# Patient Record
Sex: Female | Born: 1970 | Race: Black or African American | Hispanic: No | Marital: Married | Smoking: Never smoker
Health system: Southern US, Community
[De-identification: ages and names within clinical notes are randomized; demographics above are authoritative.]

---

## 2002-07-03 ENCOUNTER — Encounter: Payer: Self-pay | Admitting: Emergency Medicine

## 2002-07-03 ENCOUNTER — Emergency Department (HOSPITAL_COMMUNITY): Admission: EM | Admit: 2002-07-03 | Discharge: 2002-07-03 | Payer: Self-pay | Admitting: Emergency Medicine

## 2004-04-23 ENCOUNTER — Other Ambulatory Visit: Admission: RE | Admit: 2004-04-23 | Discharge: 2004-04-23 | Payer: Self-pay | Admitting: Obstetrics and Gynecology

## 2005-02-25 ENCOUNTER — Encounter: Admission: RE | Admit: 2005-02-25 | Discharge: 2005-02-25 | Payer: Self-pay | Admitting: Emergency Medicine

## 2007-04-15 ENCOUNTER — Encounter: Admission: RE | Admit: 2007-04-15 | Discharge: 2007-04-15 | Payer: Self-pay | Admitting: Obstetrics and Gynecology

## 2009-09-27 ENCOUNTER — Encounter: Admission: RE | Admit: 2009-09-27 | Discharge: 2009-09-27 | Payer: Self-pay | Admitting: Obstetrics and Gynecology

## 2009-10-15 ENCOUNTER — Encounter: Admission: RE | Admit: 2009-10-15 | Discharge: 2009-10-15 | Payer: Self-pay | Admitting: Obstetrics and Gynecology

## 2010-11-03 ENCOUNTER — Encounter: Admission: RE | Admit: 2010-11-03 | Discharge: 2010-11-03 | Payer: Self-pay | Admitting: Obstetrics and Gynecology

## 2011-10-19 ENCOUNTER — Other Ambulatory Visit: Payer: Self-pay | Admitting: Obstetrics and Gynecology

## 2011-10-19 ENCOUNTER — Other Ambulatory Visit: Payer: Self-pay | Admitting: *Deleted

## 2011-10-19 DIAGNOSIS — Z1231 Encounter for screening mammogram for malignant neoplasm of breast: Secondary | ICD-10-CM

## 2011-12-10 ENCOUNTER — Ambulatory Visit
Admission: RE | Admit: 2011-12-10 | Discharge: 2011-12-10 | Disposition: A | Discharge status: Home / Self Care | Payer: BC Managed Care – PPO | Source: Ambulatory Visit | Attending: Obstetrics and Gynecology | Admitting: Obstetrics and Gynecology

## 2011-12-10 DIAGNOSIS — Z1231 Encounter for screening mammogram for malignant neoplasm of breast: Secondary | ICD-10-CM

## 2012-10-12 ENCOUNTER — Other Ambulatory Visit: Payer: Self-pay | Admitting: Obstetrics and Gynecology

## 2012-10-12 DIAGNOSIS — Z1231 Encounter for screening mammogram for malignant neoplasm of breast: Secondary | ICD-10-CM

## 2012-12-12 ENCOUNTER — Ambulatory Visit
Admission: RE | Admit: 2012-12-12 | Discharge: 2012-12-12 | Disposition: A | Discharge status: Home / Self Care | Payer: BC Managed Care – PPO | Source: Ambulatory Visit | Attending: Obstetrics and Gynecology | Admitting: Obstetrics and Gynecology

## 2012-12-12 DIAGNOSIS — Z1231 Encounter for screening mammogram for malignant neoplasm of breast: Secondary | ICD-10-CM

## 2013-11-16 ENCOUNTER — Other Ambulatory Visit: Payer: Self-pay

## 2013-11-16 DIAGNOSIS — Z1231 Encounter for screening mammogram for malignant neoplasm of breast: Secondary | ICD-10-CM

## 2014-03-06 ENCOUNTER — Ambulatory Visit: Payer: BC Managed Care – PPO

## 2014-03-09 ENCOUNTER — Ambulatory Visit
Admission: RE | Admit: 2014-03-09 | Discharge: 2014-03-09 | Disposition: A | Discharge status: Home / Self Care | Payer: BC Managed Care – PPO | Source: Ambulatory Visit

## 2014-03-09 DIAGNOSIS — Z1231 Encounter for screening mammogram for malignant neoplasm of breast: Secondary | ICD-10-CM

## 2015-04-24 ENCOUNTER — Other Ambulatory Visit: Payer: Self-pay

## 2015-04-24 DIAGNOSIS — Z1231 Encounter for screening mammogram for malignant neoplasm of breast: Secondary | ICD-10-CM

## 2015-06-04 ENCOUNTER — Ambulatory Visit
Admission: RE | Admit: 2015-06-04 | Discharge: 2015-06-04 | Disposition: A | Discharge status: Home / Self Care | Payer: BC Managed Care – PPO | Source: Ambulatory Visit

## 2015-06-04 ENCOUNTER — Ambulatory Visit (INDEPENDENT_AMBULATORY_CARE_PROVIDER_SITE_OTHER): Payer: BC Managed Care – PPO | Admitting: Podiatry

## 2015-06-04 ENCOUNTER — Ambulatory Visit: Payer: Self-pay

## 2015-06-04 ENCOUNTER — Encounter: Payer: Self-pay | Admitting: Podiatry

## 2015-06-04 VITALS — BP 117/75 | HR 83 | Resp 12

## 2015-06-04 DIAGNOSIS — M2011 Hallux valgus (acquired), right foot: Secondary | ICD-10-CM | POA: Diagnosis not present

## 2015-06-04 DIAGNOSIS — L84 Corns and callosities: Secondary | ICD-10-CM

## 2015-06-04 DIAGNOSIS — M21611 Bunion of right foot: Secondary | ICD-10-CM

## 2015-06-04 DIAGNOSIS — M779 Enthesopathy, unspecified: Secondary | ICD-10-CM | POA: Diagnosis not present

## 2015-06-04 DIAGNOSIS — Z1231 Encounter for screening mammogram for malignant neoplasm of breast: Secondary | ICD-10-CM

## 2015-06-04 DIAGNOSIS — M2041 Other hammer toe(s) (acquired), right foot: Secondary | ICD-10-CM | POA: Diagnosis not present

## 2015-06-04 MED ORDER — TRIAMCINOLONE ACETONIDE 10 MG/ML IJ SUSP: 10.0000 mg | Freq: Once | INTRAMUSCULAR | Status: AC

## 2015-06-04 NOTE — Progress Notes (Signed)
   Subjective:    Patient ID: Janice Montes, female    DOB: 08-29-1971, 44 y.o.   MRN: [id number]  HPI Patient presents BIL corns which appeared about 3 months ago. Corns on 4th toes do not cause as much discomfort as the R1-2nd toes do. Patient has tried to use over the counter corn remover to no success.   Review of Systems  Constitutional: Negative.   HENT: Positive for sinus pressure.   Eyes: Negative.   Respiratory: Negative.   Cardiovascular: Negative.   Gastrointestinal: Negative.   Endocrine: Negative.   Genitourinary: Negative.   Musculoskeletal: Positive for back pain.  Skin: Positive for rash and wound.  Allergic/Immunologic: Negative.   Neurological: Negative.   Hematological: Negative.   Psychiatric/Behavioral: Negative.        Objective:   Physical Exam        Assessment & Plan:

## 2015-06-04 NOTE — Progress Notes (Signed)
Subjective:     Patient ID: Janice Montes, female   DOB: 05/15/1971, 44 y.o.   MRN: [id number]  HPI patient states that she's been getting more problems between the big toe and second toe of the right foot with lesion formation and pain. Does not remember any other change but she did have previous bunion surgery done on this foot approximate 7 years ago and on the left foot done approximately 3 years ago   Review of Systems  All other systems reviewed and are negative.      Objective:   Physical Exam  Constitutional: She is oriented to person, place, and time.  Cardiovascular: Intact distal pulses.   Musculoskeletal: Normal range of motion.  Neurological: She is oriented to person, place, and time.  Skin: Skin is warm.  Nursing note and vitals reviewed.  neurovascular status intact muscle strength adequate range of motion within normal limits. Patient's found to have healed surgical sites right and left first metatarsal with deviation of the right hallux against the second toe with large hyperostosis and minimal on the left foot. There is an inflammatory callus formation on the right second toe and right big toe with fluid buildup at the interphalangeal joint right second toe with depression of the arch noted bilateral     Assessment:     Inflammatory capsulitis right second digit with structural abnormality the first metatarsal and bunion that has returned over the last 7 years with screw in place first metatarsal    Plan:     H&P and x-rays reviewed of both feet. Today I did a careful injection of the interphalangeal joint right second toe and debrided lesions on the right big toe second toe and then scanned for custom orthotics to try to lift the arch and take pressure off the medial arch of both feet. Patient may ultimately require surgery and I explained possibility for Lapidus fusion with this condition but that we'll be made based on response to conservative care

## 2015-06-25 ENCOUNTER — Ambulatory Visit: Payer: BC Managed Care – PPO | Admitting: *Deleted

## 2015-06-25 DIAGNOSIS — M779 Enthesopathy, unspecified: Secondary | ICD-10-CM

## 2015-06-25 NOTE — Patient Instructions (Signed)

## 2015-06-27 NOTE — Progress Notes (Signed)
Patient ID: Janice Montes, female   DOB: 1971-02-26, 44 y.o.   MRN: [id number] Patient presents for orthotic pick up.  Verbal and written break in and wear instructions given.  Patient will follow up in 4 weeks if symptoms worsen or fail to improve.

## 2015-09-12 ENCOUNTER — Telehealth: Payer: Self-pay | Admitting: *Deleted

## 2015-09-12 NOTE — Telephone Encounter (Signed)
I left patient a message that she has a $700 deductible.  Once deductible is met insurance will cover at 80% and you will be responsible for 20%.  The estimate for physician is about $1000.  We do make payment arrangements.  You will need to contact the surgical center to get their cost as well as anesthesia.  Call if you have any additional questions.

## 2015-09-12 NOTE — Telephone Encounter (Signed)
"  Can I get a cost analysis for surgery?"

## 2016-06-22 ENCOUNTER — Other Ambulatory Visit: Payer: Self-pay | Admitting: Obstetrics and Gynecology

## 2016-06-22 DIAGNOSIS — Z1231 Encounter for screening mammogram for malignant neoplasm of breast: Secondary | ICD-10-CM

## 2016-07-09 ENCOUNTER — Ambulatory Visit: Payer: BC Managed Care – PPO

## 2016-09-17 ENCOUNTER — Ambulatory Visit
Admission: RE | Admit: 2016-09-17 | Discharge: 2016-09-17 | Disposition: A | Discharge status: Home / Self Care | Payer: BC Managed Care – PPO | Source: Ambulatory Visit | Attending: Obstetrics and Gynecology | Admitting: Obstetrics and Gynecology

## 2016-09-17 ENCOUNTER — Encounter: Payer: Self-pay | Admitting: Podiatry

## 2016-09-17 ENCOUNTER — Ambulatory Visit (INDEPENDENT_AMBULATORY_CARE_PROVIDER_SITE_OTHER): Payer: BC Managed Care – PPO | Admitting: Podiatry

## 2016-09-17 ENCOUNTER — Ambulatory Visit (INDEPENDENT_AMBULATORY_CARE_PROVIDER_SITE_OTHER): Payer: BC Managed Care – PPO

## 2016-09-17 DIAGNOSIS — Z1231 Encounter for screening mammogram for malignant neoplasm of breast: Secondary | ICD-10-CM

## 2016-09-17 DIAGNOSIS — M79672 Pain in left foot: Secondary | ICD-10-CM

## 2016-09-17 DIAGNOSIS — M79671 Pain in right foot: Secondary | ICD-10-CM

## 2016-09-17 DIAGNOSIS — M21611 Bunion of right foot: Secondary | ICD-10-CM

## 2016-09-17 DIAGNOSIS — M2041 Other hammer toe(s) (acquired), right foot: Secondary | ICD-10-CM

## 2016-09-17 NOTE — Progress Notes (Signed)
Subjective:     Patient ID: Janice Montes, female   DOB: [birth year], 45 y.o.   MRN: [id number]  HPI patient states that she's getting increased pain between the big toe and second toe of the right foot with keratotic lesion second digit. States that when I trimmed a gave her temporary relief but it's becoming more tender for   Review of Systems     Objective:   Physical Exam Neurovascular status intact muscle strength adequate range of motion within normal limits with patient found to have significant structural bunion deformity with hyperostosis on the medial side the second toe and big toe or inner facing with abnormalities of the hallux with pressure between the hallux and second toe with keratotic lesion formation between the 2. 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 I discussed at great length the problem of this and the elevation of the intermetatarsal angle and I do think long-term it would be best to consider Lapidus fusion along with possible distal osteotomy or Akin osteotomy with arthroplasty second digit and removal of screw. I also discussed a very simple procedure which would allow her to weight-bear right away consisting of removal of bone along with Akin osteotomy and arthroplasty digit 2. She most likely will opt for the more complex procedure and I am get a have her see Dr. Ardelle AntonWagoner for consult in mid November with surgery tentatively to be done in the middle of December  X-ray report indicate significant elevation of the intermetatarsal angle screw in position first metatarsal with  angle which has not been adequately resolved with a abutment of the hallux against second toe with elevation of hallux interphalangeus angle

## 2016-10-23 ENCOUNTER — Ambulatory Visit: Payer: BC Managed Care – PPO | Admitting: Podiatry

## 2016-10-23 ENCOUNTER — Ambulatory Visit (INDEPENDENT_AMBULATORY_CARE_PROVIDER_SITE_OTHER): Payer: BC Managed Care – PPO | Admitting: Podiatry

## 2016-10-23 DIAGNOSIS — M79671 Pain in right foot: Secondary | ICD-10-CM | POA: Diagnosis not present

## 2016-10-23 DIAGNOSIS — M79672 Pain in left foot: Secondary | ICD-10-CM

## 2016-10-23 DIAGNOSIS — M2041 Other hammer toe(s) (acquired), right foot: Secondary | ICD-10-CM | POA: Diagnosis not present

## 2016-10-23 DIAGNOSIS — L84 Corns and callosities: Secondary | ICD-10-CM | POA: Diagnosis not present

## 2016-10-23 DIAGNOSIS — M21611 Bunion of right foot: Secondary | ICD-10-CM | POA: Diagnosis not present

## 2016-10-27 NOTE — Progress Notes (Signed)
Subjective: 45 year old female presents the office today for surgical consultation of pain to her right foot. She has a corn between her big toe and her second toe which is painful. She does have a history of bunion surgery 10 years ago however she did have reoccurrence. Her big toe does about her second toe causing discomfort. She has tried shoe changes, offloading, padding without any significant resolution. At today's visit she presents today for surgical consultation. Denies any systemic complaints such as fevers, chills, nausea, vomiting. No acute changes since last appointment, and no other complaints at this time.   Objective: AAO x3, NAD DP/PT pulses palpable bilaterally, CRT less than 3 seconds Moderate bunion deformity is present on the right side. There is hallux interphalangeus present  as well as a hammertoe second toe. There is a hyperkeratotic lesion on the abutting surfaces of the medial second toe and the lateral hallux where the toes are pressing. There is no pain or crepitation with MPJ range of motion. Hypermobility of the first ray is present.  No open lesions or pre-ulcerative lesions.  No pain with calf compression, swelling, warmth, erythema  Assessment: Symptomatic hyperkeratotic lesions likely due to bunion and hammertoe deformities  Plan: -All treatment options discussed with the patient including all alternatives, risks, complications.  -X-rays were reviewed as well as the chart. I discussed with the patient both conservative and surgical treatment options. She wishes to pursue a surgical intervention. I discussed with her correction of the bunion deformity as well as the hammertoe. At a long discussion with her in regard to these options. She would likely benefit mostly from a Lapidus bunionectomy with possible Akin and hammertoe correction of the second toe. However she does not wish to have this type of recovery. If she does not want to do this I recommend an Akin  osteotomy as well as hammertoe repair. Discussed with her this may give her temporary results but has a higher chance of recurrence. She will consider her options and call the office for follow-up. -Patient encouraged to call the office with any questions, concerns, change in symptoms.    Ovid CurdMatthew Eirik Schueler, DPM

## 2016-11-09 ENCOUNTER — Telehealth: Payer: Self-pay | Admitting: *Deleted

## 2016-11-09 NOTE — Telephone Encounter (Signed)
"  I would like to schedule my surgery."  When would you like to schedule and have you signed consent forms?  "I would like to do it on December 18.  I have not signed consent forms."  You will need to schedule an appointment with Dr. Ardelle AntonWagoner for a consultation.  "I already saw him for a consultation."  You saw him for your initial visit.  "I saw Dr. Charlsie Merlesegal for my initial visit.  Then I saw Dr. Ardelle AntonWagoner for a consult.  Does that mean I will have to pay another co-pay?"  Yes, you will have to pay another co-pay.  "Can't I just come by and sign the consent forms without seeing him?"  No, you will need to see him so he can go over the procedure in depth.  "He has already done that!  Can I speak to him?"  He is seeing patients, I can give him a message.  "Well do that."  Let me see if I can catch him.  He said he does need to see you but he will not charge you for the visit.  We cannot schedule surgery for December 18.  He can do it on December 20.  Wednesday is his surgery day.  "Wednesday December 20 is fine.  Can you schedule my appointment with him for the consultation?"  Let me transfer you to a scheduler.

## 2016-11-20 ENCOUNTER — Encounter: Payer: Self-pay | Admitting: Podiatry

## 2016-11-20 ENCOUNTER — Ambulatory Visit (INDEPENDENT_AMBULATORY_CARE_PROVIDER_SITE_OTHER): Payer: BC Managed Care – PPO | Admitting: Podiatry

## 2016-11-20 VITALS — BP 114/80

## 2016-11-20 DIAGNOSIS — M21611 Bunion of right foot: Secondary | ICD-10-CM

## 2016-11-20 DIAGNOSIS — M2041 Other hammer toe(s) (acquired), right foot: Secondary | ICD-10-CM

## 2016-11-20 NOTE — Patient Instructions (Signed)

## 2016-11-23 NOTE — Progress Notes (Signed)
Subjective: 45 year old female presents the office today for surgical consultation of pain to her right foot. She continues have a painful corn which recurs on her second toe and her big toe as well. Her big toes pressing on the second toe causing pain with pressure in shoe gear. She previously had a bunion surgery about 10 years ago which resulted in reoccurrence. She is tried shoe changes, offloading, padding the ascending resolution of this time she requested surgical intervention to help decrease her pain and deformity. Denies any systemic complaints such as fevers, chills, nausea, vomiting. No acute changes since last appointment, and no other complaints at this time.   Objective: AAO x3, NAD DP/PT pulses palpable bilaterally, CRT less than 3 seconds Moderate bunion deformity is present on the right side. There is hallux interphalangeus present  as well as a hammertoe second toe. There is a hyperkeratotic lesion on the abutting surfaces of the medial second toe and the lateral hallux where the toes are pressing. There is no pain or crepitation with MPJ range of motion. Hypermobility of the first ray is present.  No open lesions or pre-ulcerative lesions.  No pain with calf compression, swelling, warmth, erythema  Assessment: Symptomatic hyperkeratotic lesions likely due to bunion and hammertoe deformities  Plan: -All treatment options discussed with the patient including all alternatives, risks, complications.  X-rays were again reviewed with the patient. I discussed both conservative and surgical treatment options with the patient. She elects to proceed with surgical intervention. After a long discussion with the patient she has elected to proceed with a Lapidus bunionectomy as well as hammertoe repair of the second toe and possible Akin osteotomy. No promises or guarantees were given as the procedure. Risks of the surgery as well as the postoperative course and she understands this and wishes to  proceed. The incision placement as well as the postoperative course was discussed with the patient. I discussed risks of the surgery which include, but not limited to, infection, bleeding, pain, swelling, need for further surgery, delayed or nonhealing, painful or ugly scar, numbness or sensation changes, over/under correction, recurrence, transfer lesions, further deformity, hardware failure, DVT/PE, loss of toe/foot. Patient understands these risks and wishes to proceed with surgery. The surgical consent was reviewed with the patient all 3 pages were signed. No promises or guarantees were given to the outcome of the procedure. All questions were answered to the best of my ability. Before the surgery the patient was encouraged to call the office if there is any further questions. [id number] basis. -Patient encouraged to call the office with any questions, concerns, change in symptoms.    Ovid CurdMatthew Wagoner, DPM

## 2016-11-24 DIAGNOSIS — M21611 Bunion of right foot: Secondary | ICD-10-CM

## 2016-12-01 ENCOUNTER — Telehealth: Payer: Self-pay | Admitting: *Deleted

## 2016-12-01 ENCOUNTER — Encounter: Payer: Self-pay | Admitting: Podiatry

## 2016-12-01 NOTE — Telephone Encounter (Signed)
I attempted to return patient's call. 

## 2016-12-01 NOTE — Telephone Encounter (Signed)
"  I am scheduled to have surgery on Wednesday, December 20.  I need some documentation for my place of business.  I also need the time of my surgery. Give me a back as soon as possible"

## 2016-12-02 ENCOUNTER — Encounter: Payer: Self-pay | Admitting: Podiatry

## 2016-12-02 DIAGNOSIS — M2011 Hallux valgus (acquired), right foot: Secondary | ICD-10-CM | POA: Diagnosis not present

## 2016-12-02 DIAGNOSIS — M2041 Other hammer toe(s) (acquired), right foot: Secondary | ICD-10-CM | POA: Diagnosis not present

## 2016-12-11 ENCOUNTER — Ambulatory Visit (INDEPENDENT_AMBULATORY_CARE_PROVIDER_SITE_OTHER): Payer: Self-pay | Admitting: Podiatry

## 2016-12-11 ENCOUNTER — Ambulatory Visit (INDEPENDENT_AMBULATORY_CARE_PROVIDER_SITE_OTHER): Payer: BC Managed Care – PPO

## 2016-12-11 ENCOUNTER — Encounter: Payer: Self-pay | Admitting: Podiatry

## 2016-12-11 VITALS — Temp 97.3°F

## 2016-12-11 DIAGNOSIS — M21611 Bunion of right foot: Secondary | ICD-10-CM

## 2016-12-11 DIAGNOSIS — Z9889 Other specified postprocedural states: Secondary | ICD-10-CM

## 2016-12-11 DIAGNOSIS — M2041 Other hammer toe(s) (acquired), right foot: Secondary | ICD-10-CM

## 2016-12-11 MED ORDER — OXYCODONE-ACETAMINOPHEN 5-325 MG PO TABS: 1.0000 | ORAL_TABLET | Freq: Three times a day (TID) | ORAL | 0 refills | Status: AC | PRN

## 2016-12-11 NOTE — Progress Notes (Signed)
Subjective: Janice Montes is a 45 y.o. is seen today in office s/p right Lapidus bunionectomy and 2nd digit hammertoe repair preformed on 12/02/16. They state their pain is controlled with pain medication and is improving. She is remaining nonweightbearing with use of cam boot. She is asking for knee scooter today. Denies any systemic complaints such as fevers, chills, nausea, vomiting. No calf pain, chest pain, shortness of breath.   Objective: General: No acute distress, AAOx3  DP/PT pulses palpable 2/4, CRT < 3 sec to all digits.  Protective sensation intact. Motor function intact.  Right foot: Incision is well coapted without any evidence of dehiscence and sutures are intact. There is no surrounding erythema, ascending cellulitis, fluctuance, crepitus, malodor, drainage/purulence. There is mild edema around the surgical site. There is mild pain along the surgical site. Toes are rectus position. K wire intact. No other areas of tenderness to bilateral lower extremities.  No other open lesions or pre-ulcerative lesions.  No pain with calf compression, swelling, warmth, erythema.   Assessment and Plan:  Status post Right foot surgery, doing well with no complications   -Treatment options discussed including all alternatives, risks, and complications -X-rays were obtained and reviewed with the patient. Status post Lapidus bunionectomy and hammertoe repair. Hardware intact. No evidence of acute fracture otherwise. -Antibiotic was applied followed by a bandage.Marland Kitchen. Keep the dressing clean, dry, intact. -Continue nonweightbearing with use of cam boot, knee scooter. She was given information about a knee scooter. -Ice/elevation -Pain medication as needed- Refilled today -Monitor for any clinical signs or symptoms of infection and DVT/PE and directed to call the office immediately should any occur or go to the ER. -Follow-up in 1 week for suture removal or sooner if any problems arise. In the  meantime, encouraged to call the office with any questions, concerns, change in symptoms.   Ovid CurdMatthew Seanne Chirico, DPM

## 2016-12-18 ENCOUNTER — Ambulatory Visit: Payer: BC Managed Care – PPO | Admitting: Podiatry

## 2016-12-28 ENCOUNTER — Ambulatory Visit (INDEPENDENT_AMBULATORY_CARE_PROVIDER_SITE_OTHER): Payer: Self-pay | Admitting: Podiatry

## 2016-12-28 ENCOUNTER — Ambulatory Visit (INDEPENDENT_AMBULATORY_CARE_PROVIDER_SITE_OTHER): Payer: BC Managed Care – PPO

## 2016-12-28 ENCOUNTER — Encounter: Payer: Self-pay | Admitting: Podiatry

## 2016-12-28 DIAGNOSIS — Z9889 Other specified postprocedural states: Secondary | ICD-10-CM

## 2016-12-28 DIAGNOSIS — M2041 Other hammer toe(s) (acquired), right foot: Secondary | ICD-10-CM

## 2016-12-28 DIAGNOSIS — M21611 Bunion of right foot: Secondary | ICD-10-CM

## 2016-12-28 MED ORDER — CEPHALEXIN 500 MG PO CAPS: 500.0000 mg | ORAL_CAPSULE | Freq: Three times a day (TID) | ORAL | 0 refills | Status: AC

## 2016-12-29 NOTE — Progress Notes (Signed)
Subjective: Janice Montes is a 46 y.o. is seen today in office s/p right Lapidus bunionectomy and 2nd digit hammertoe repair preformed on 12/02/16. She states that she is not taking pain medicine this time as her pain has been controlled. She has been nonweightbearing with use of the boot. She presents today for suture removal.  Denies any systemic complaints such as fevers, chills, nausea, vomiting. No calf pain, chest pain, shortness of breath.   Objective: General: No acute distress, AAOx3  DP/PT pulses palpable 2/4, CRT < 3 sec to all digits.  Protective sensation intact. Motor function intact.  Right foot: Incision is well coapted without any evidence of dehiscence and sutures are intact. There is no surrounding erythema, ascending cellulitis, fluctuance, crepitus, malodor. A small amount of clear drainage was expressed from around the sutures but there was no purulence.  There is minimal edema around the surgical site. There is no pain along the surgical site. Toes are rectus position. K wire intact to the 2nd toe.  No other areas of tenderness to bilateral lower extremities.  No other open lesions or pre-ulcerative lesions.  No pain with calf compression, swelling, warmth, erythema.   Assessment and Plan:  Status post Right foot surgery, doing well with no complications   -Treatment options discussed including all alternatives, risks, and complications -X-rays were obtained and reviewed with the patient. Status post Lapidus bunionectomy and hammertoe repair. Hardware intact. No evidence of acute fracture otherwise. -Sutures removed today without complications. Due to the small amount of serous drainage, will start keflex as a precaution. Monitor for any signs or symptoms of infection and to call the office immediately should any occur.  -NWB in CAM boot.  -Ice/elevation -Monitor for any clinical signs or symptoms of infection and DVT/PE and directed to call the office immediately should  any occur or go to the ER. -Follow-up in 2 weeks for pin removal or sooner if any problems arise. In the meantime, encouraged to call the office with any questions, concerns, change in symptoms.   *x-ray next appointment; likely pin removal and transfer to WBAT in CAM boot and PT consult.   Ovid CurdMatthew Haze Montes, DPM

## 2017-01-11 ENCOUNTER — Ambulatory Visit (INDEPENDENT_AMBULATORY_CARE_PROVIDER_SITE_OTHER): Payer: Self-pay | Admitting: Podiatry

## 2017-01-11 ENCOUNTER — Ambulatory Visit (INDEPENDENT_AMBULATORY_CARE_PROVIDER_SITE_OTHER): Payer: BC Managed Care – PPO

## 2017-01-11 ENCOUNTER — Encounter: Payer: Self-pay | Admitting: Podiatry

## 2017-01-11 VITALS — BP 111/67 | HR 87 | Resp 14

## 2017-01-11 DIAGNOSIS — Z9889 Other specified postprocedural states: Secondary | ICD-10-CM

## 2017-01-11 DIAGNOSIS — M21619 Bunion of unspecified foot: Secondary | ICD-10-CM

## 2017-01-11 DIAGNOSIS — M2041 Other hammer toe(s) (acquired), right foot: Secondary | ICD-10-CM

## 2017-01-11 NOTE — Progress Notes (Signed)
DOS 12.20.2017 Right foot lapidus bunionectomy, possible aiken (correction of bunion deformity) with hammertoe repair 2nd toe.

## 2017-01-11 NOTE — Progress Notes (Signed)
Subjective: Janice Montes is a 46 y.o. is seen today in office s/p right Lapidus bunionectomy and 2nd digit hammertoe repair preformed on 12/02/16. She presents today for removal of the k-wire. She has been nonweightbearing with use of the boot. Denies any systemic complaints such as fevers, chills, nausea, vomiting. No calf pain, chest pain, shortness of breath.   Objective: General: No acute distress, AAOx3  DP/PT pulses palpable 2/4, CRT < 3 sec to all digits.  Protective sensation intact. Motor function intact.  Right foot: Incision is well coapted without any evidence and a scar has formed. There is no surrounding erythema, ascending cellulitis, fluctuance, crepitus, malodor. A no drainage was expressed from incisions or along the k-wire  There is minimal edema around the surgical site. There is no pain along the surgical site. Toes are rectus position. K wire intact to the 2nd toe.  No other areas of tenderness to bilateral lower extremities.  No other open lesions or pre-ulcerative lesions.  No pain with calf compression, swelling, warmth, erythema.   Assessment and Plan:  Status post Right foot surgery, doing well with no complications   -Treatment options discussed including all alternatives, risks, and complications -X-rays were obtained and reviewed with the patient. Status post Lapidus bunionectomy and hammertoe repair. Hardware intact. No evidence of acute fracture otherwise. -K-wire removed today in total without incident.  -Wire was removed today without complications.  -She can start to slowly transition to WBAT in CAM boot over the next couple of weeks. Will start PT as well.  -Ice/elevation -Monitor for any clinical signs or symptoms of infection and DVT/PE and directed to call the office immediately should any occur or go to the ER. -Follow-up in 2-3 weeks  or sooner if any problems arise. In the meantime, encouraged to call the office with any questions, concerns, change  in symptoms.   Ovid CurdMatthew Wagoner, DPM

## 2017-01-12 ENCOUNTER — Encounter: Payer: Self-pay | Admitting: Podiatry

## 2017-02-01 ENCOUNTER — Ambulatory Visit (INDEPENDENT_AMBULATORY_CARE_PROVIDER_SITE_OTHER): Payer: BC Managed Care – PPO

## 2017-02-01 ENCOUNTER — Ambulatory Visit (INDEPENDENT_AMBULATORY_CARE_PROVIDER_SITE_OTHER): Payer: BC Managed Care – PPO | Admitting: Podiatry

## 2017-02-01 ENCOUNTER — Encounter: Payer: Self-pay | Admitting: Podiatry

## 2017-02-01 VITALS — BP 124/84 | HR 82 | Resp 14

## 2017-02-01 DIAGNOSIS — M2011 Hallux valgus (acquired), right foot: Secondary | ICD-10-CM | POA: Diagnosis not present

## 2017-02-01 DIAGNOSIS — M21619 Bunion of unspecified foot: Secondary | ICD-10-CM

## 2017-02-01 DIAGNOSIS — Z9889 Other specified postprocedural states: Secondary | ICD-10-CM

## 2017-02-03 NOTE — Progress Notes (Signed)
Subjective: Janice Montes is a 46 y.o. is seen today in office s/p right Lapidus bunionectomy and 2nd digit hammertoe repair preformed on 12/02/16. She states that she is doing well. She's been continue physical therapy. She is been able to work and she has been wearing a regular shoe over the last 2 weeks. Today she states that she has a little bit of discomfort. She's been in feet quite a bit of the mat she's been doing well. Denies any systemic complaints such as fevers, chills, nausea, vomiting. No calf pain, chest pain, shortness of breath.   As an aside today she was concerned that she is developing a nut allergy. She should discuss this with the primary care physician. She is currently no having any symptoms but when she has had nuts twice recently she lips swell. No SOB or other symptoms of anaphylaxis.  Objective: General: No acute distress, AAOx3  DP/PT pulses palpable 2/4, CRT < 3 sec to all digits.  Protective sensation intact. Motor function intact.  Right foot: Incision is well coapted without any evidence and a scar has formed. There is no surrounding erythema, ascending cellulitis, fluctuance, crepitus, malodor. The hallux and second toes in rectus position. So far there is not any recurrence of the hyperkeratotic lesion in between the 2 toes. There is good range of motion the first MTPJ. Second MPJ range of motion intact. No other open lesions or pre-ulcerative lesions.  No pain with calf compression, swelling, warmth, erythema.   Assessment and Plan:  Status post Right foot surgery, doing well with no complications   -Treatment options discussed including all alternatives, risks, and complications -X-rays were obtained and reviewed with the patient. Status post Lapidus bunionectomy and hammertoe repair. There is increased consolidation across the arthrodesis site. Hardware intact. No evidence of acute fracture otherwise. -Continue with supportive shoe gear. Long-term also  discuss orthotics. -Continue physical therapy. -Ice/elevation -Monitor for any clinical signs or symptoms of infection and DVT/PE and directed to call the office immediately should any occur or go to the ER. -Follow-up as scheduled  or sooner if any problems arise. In the meantime, encouraged to call the office with any questions, concerns, change in symptoms.   Ovid CurdMatthew Kymoni Lesperance, DPM

## 2017-03-19 ENCOUNTER — Ambulatory Visit (INDEPENDENT_AMBULATORY_CARE_PROVIDER_SITE_OTHER): Payer: BC Managed Care – PPO

## 2017-03-19 ENCOUNTER — Ambulatory Visit (INDEPENDENT_AMBULATORY_CARE_PROVIDER_SITE_OTHER): Payer: Self-pay | Admitting: Podiatry

## 2017-03-19 ENCOUNTER — Encounter: Payer: Self-pay | Admitting: Podiatry

## 2017-03-19 DIAGNOSIS — Z9889 Other specified postprocedural states: Secondary | ICD-10-CM

## 2017-03-19 DIAGNOSIS — S92901K Unspecified fracture of right foot, subsequent encounter for fracture with nonunion: Secondary | ICD-10-CM

## 2017-03-19 NOTE — Progress Notes (Signed)
Subjective: Janice Montes is a 46 y.o. is seen today in office s/p right Lapidus bunionectomy and 2nd digit hammertoe repair preformed on 12/02/16. She states that she is slowly improving all that she still gets some stiffness to her ankle and her foot. She has been able to wear regular shoe and she has increased her activity and she has gone to 4 mile walks. She has continue with stretching, icing at home as well. Denies any systemic complaints such as fevers, chills, nausea, vomiting. No calf pain, chest pain, shortness of breath.   Objective: General: No acute distress, AAOx3  DP/PT pulses palpable 2/4, CRT < 3 sec to all digits.  Protective sensation intact. Motor function intact.  Right foot: Incision is well coapted without any evidence and a scar has formed.  There is minimal tenderness to palpation on the surgical site. The hallux is in a rectus position as well as second digit. There is no recurrence of the hyperkeratotic lesion interdigitally as well. No other areas of tenderness identified this time. There does appear to be mild prominence the metatarsal heads plantarly. No other open lesions or pre-ulcerative lesions.  No pain with calf compression, swelling, warmth, erythema.   Assessment and Plan:  Status post Right foot surgery, with some residual stiffness subjectively.  -Treatment options discussed including all alternatives, risks, and complications -X-rays were obtained and reviewed with the patient. Status post Lapidus bunionectomy and hammertoe repair. There is increased consolidation across the arthrodesis site however radio [place] still evident consistent with a nonunion. -We will try to get a bone cement ordered. I contacted the representative for this today. -Continue supportive shoe gear. -Continue stretching and icing exercises. -Ice/elevation -Monitor for any clinical signs or symptoms of infection and DVT/PE and directed to call the office immediately should any  occur or go to the ER. -Follow-up as scheduled  or sooner if any problems arise. In the meantime, encouraged to call the office with any questions, concerns, change in symptoms.   Ovid Curd, DPM

## 2017-03-22 ENCOUNTER — Telehealth: Payer: Self-pay | Admitting: *Deleted

## 2017-03-22 NOTE — Telephone Encounter (Signed)
Janice Montes - [employer] picked up clinicals and Demographics to process for bone growth stimulator.

## 2017-05-28 ENCOUNTER — Encounter: Payer: Self-pay | Admitting: Podiatry

## 2017-05-28 ENCOUNTER — Ambulatory Visit (INDEPENDENT_AMBULATORY_CARE_PROVIDER_SITE_OTHER): Payer: BC Managed Care – PPO

## 2017-05-28 ENCOUNTER — Ambulatory Visit (INDEPENDENT_AMBULATORY_CARE_PROVIDER_SITE_OTHER): Payer: BC Managed Care – PPO | Admitting: Podiatry

## 2017-05-28 DIAGNOSIS — Z9889 Other specified postprocedural states: Secondary | ICD-10-CM

## 2017-05-28 DIAGNOSIS — M722 Plantar fascial fibromatosis: Secondary | ICD-10-CM

## 2017-05-28 NOTE — Patient Instructions (Signed)

## 2017-05-30 NOTE — Progress Notes (Signed)
Subjective: Janice Montes is a 46 y.o. is seen today in office s/p right Lapidus bunionectomy and 2nd digit hammertoe repair preformed on 12/02/16. She's that she is doing well. Only concern she does get pain to the arch of the foot at times in the right side. She did not get the bone stimulator due to cost. She is able to do her daily activities without any pain and she is wearing a regular shoe. She is also been gradually increase in activity and she is asking she return to running. She has no other concerns today and overall she feels that she is doing well. Denies any systemic complaints such as fevers, chills, nausea, vomiting. No calf pain, chest pain, shortness of breath.   Objective: General: No acute distress, AAOx3  DP/PT pulses palpable 2/4, CRT < 3 sec to all digits.  Protective sensation intact. Motor function intact.  Right foot: Incision is well coapted without any evidence and a scar has formed. There is no discomfort to the surgical site on the dorsal aspect of the first metatarsocuneiform joint or the second toe. There is no recurrence the hyperkeratotic lesion to the toe. She has a minimal discomfort on the plantar fascia on the medial arch of the foot there is no pain on the insertion of the plantar fascia. No pain along the Achilles tendon and this is intact. There is no pain lateral compression of the calcaneus. There is no area pinpoint bony tenderness pain vibratory sensation. Trace edema to the second toe foot overall is much improved. No erythema or increase in warmth.  No other open lesions or pre-ulcerative lesions.  No pain with calf compression, swelling, warmth, erythema.   Assessment and Plan:  Status post Right foot surgery, with some residual stiffness subjectively.  -Treatment options discussed including all alternatives, risks, and complications -X-rays were obtained and reviewed with the patient. Status post Lapidus bunionectomy and hammertoe repair. There is  increased consolidation across the arthrodesis site. -Dispensed a surgical standpoint she is doing well. With the patient in the arch of the foot is more as a result of plantar fasciitis. Discussed stretching, icing she has a day as well as supportive shoes as well as inserts. She will try these changes. -She wishes to hold off on bone stimulator due to cost. At this point an outpatient needs of actually has there is improvement in consolidation across the arthrodesis site she's having no pain to the surgical site. -At this point she is doing well from a see her back as needed. I encouraged her to call any questions or concerns or any change in symptoms and she agrees to this plan.  Ovid CurdMatthew Wagoner, DPM

## 2017-11-03 ENCOUNTER — Other Ambulatory Visit: Payer: Self-pay | Admitting: Obstetrics and Gynecology

## 2017-11-03 DIAGNOSIS — Z1231 Encounter for screening mammogram for malignant neoplasm of breast: Secondary | ICD-10-CM

## 2017-12-08 ENCOUNTER — Ambulatory Visit
Admission: RE | Admit: 2017-12-08 | Discharge: 2017-12-08 | Disposition: A | Discharge status: Home / Self Care | Payer: BC Managed Care – PPO | Source: Ambulatory Visit | Attending: Obstetrics and Gynecology | Admitting: Obstetrics and Gynecology

## 2017-12-08 DIAGNOSIS — Z1231 Encounter for screening mammogram for malignant neoplasm of breast: Secondary | ICD-10-CM

## 2018-03-10 ENCOUNTER — Other Ambulatory Visit: Payer: Self-pay | Admitting: Family Medicine

## 2018-03-10 DIAGNOSIS — N644 Mastodynia: Secondary | ICD-10-CM

## 2018-03-28 ENCOUNTER — Ambulatory Visit
Admission: RE | Admit: 2018-03-28 | Discharge: 2018-03-28 | Disposition: A | Discharge status: Home / Self Care | Payer: BC Managed Care – PPO | Source: Ambulatory Visit | Attending: Family Medicine | Admitting: Family Medicine

## 2018-03-28 DIAGNOSIS — N644 Mastodynia: Secondary | ICD-10-CM

## 2018-11-21 ENCOUNTER — Other Ambulatory Visit: Payer: Self-pay | Admitting: Family Medicine

## 2018-11-21 DIAGNOSIS — Z1231 Encounter for screening mammogram for malignant neoplasm of breast: Secondary | ICD-10-CM

## 2019-11-02 IMAGING — MG DIGITAL DIAGNOSTIC UNILATERAL RIGHT MAMMOGRAM WITH TOMO AND CAD
4 series · 4 of 12 positions shown · non-contrast
Comparison: Previous exams including most recent bilateral
screening mammogram dated 12/08/2017.

CLINICAL DATA: Patient describes tenderness within the inferior
RIGHT breast, intermittent since [REDACTED].

EXAM:
DIGITAL DIAGNOSTIC RIGHT MAMMOGRAM WITH CAD AND TOMO
ULTRASOUND RIGHT BREAST

[R CC synth-2D]
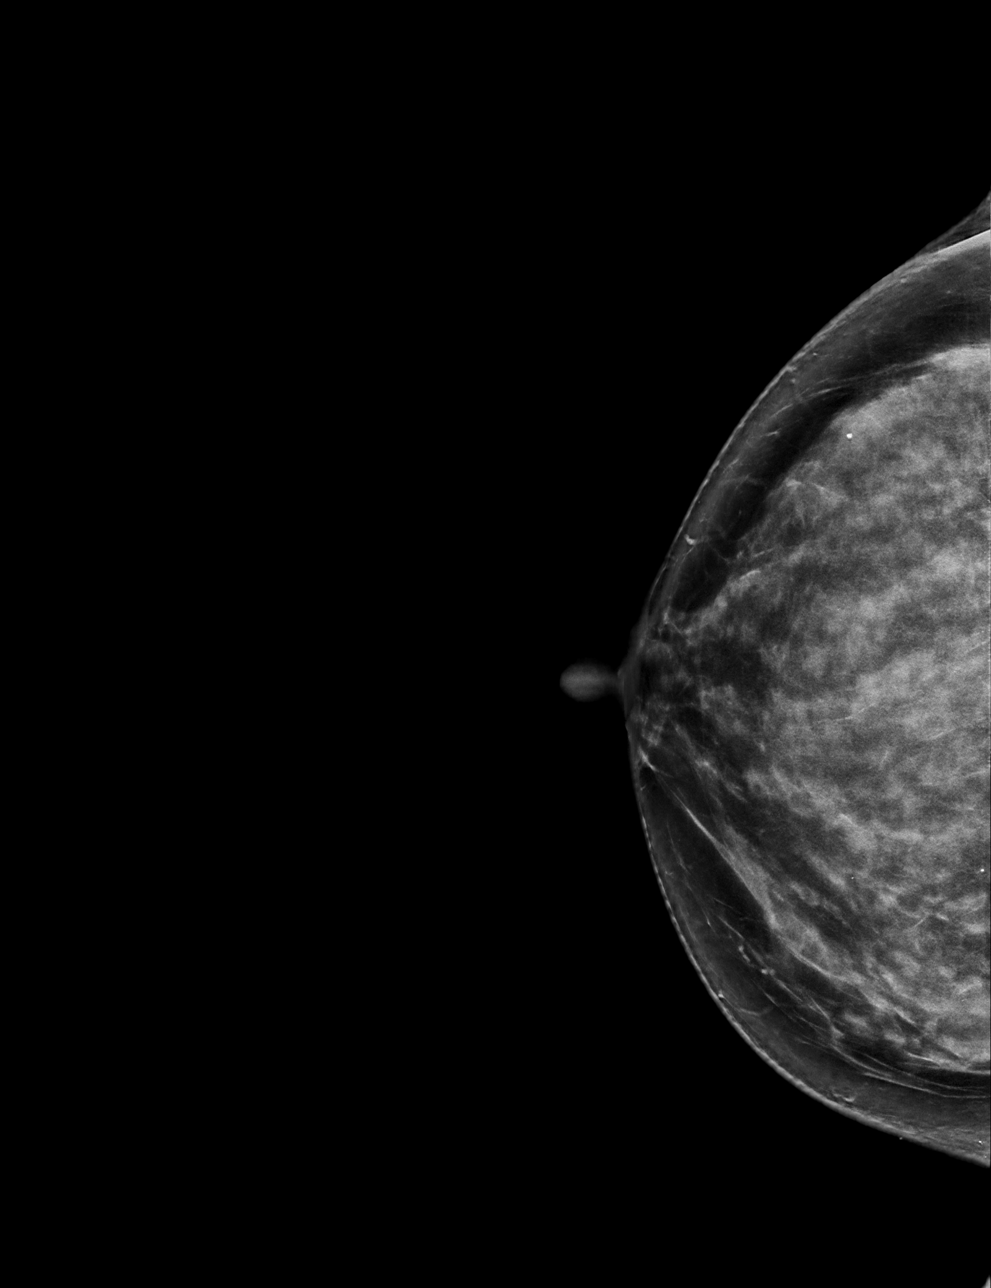

[R MLO synth-2D]
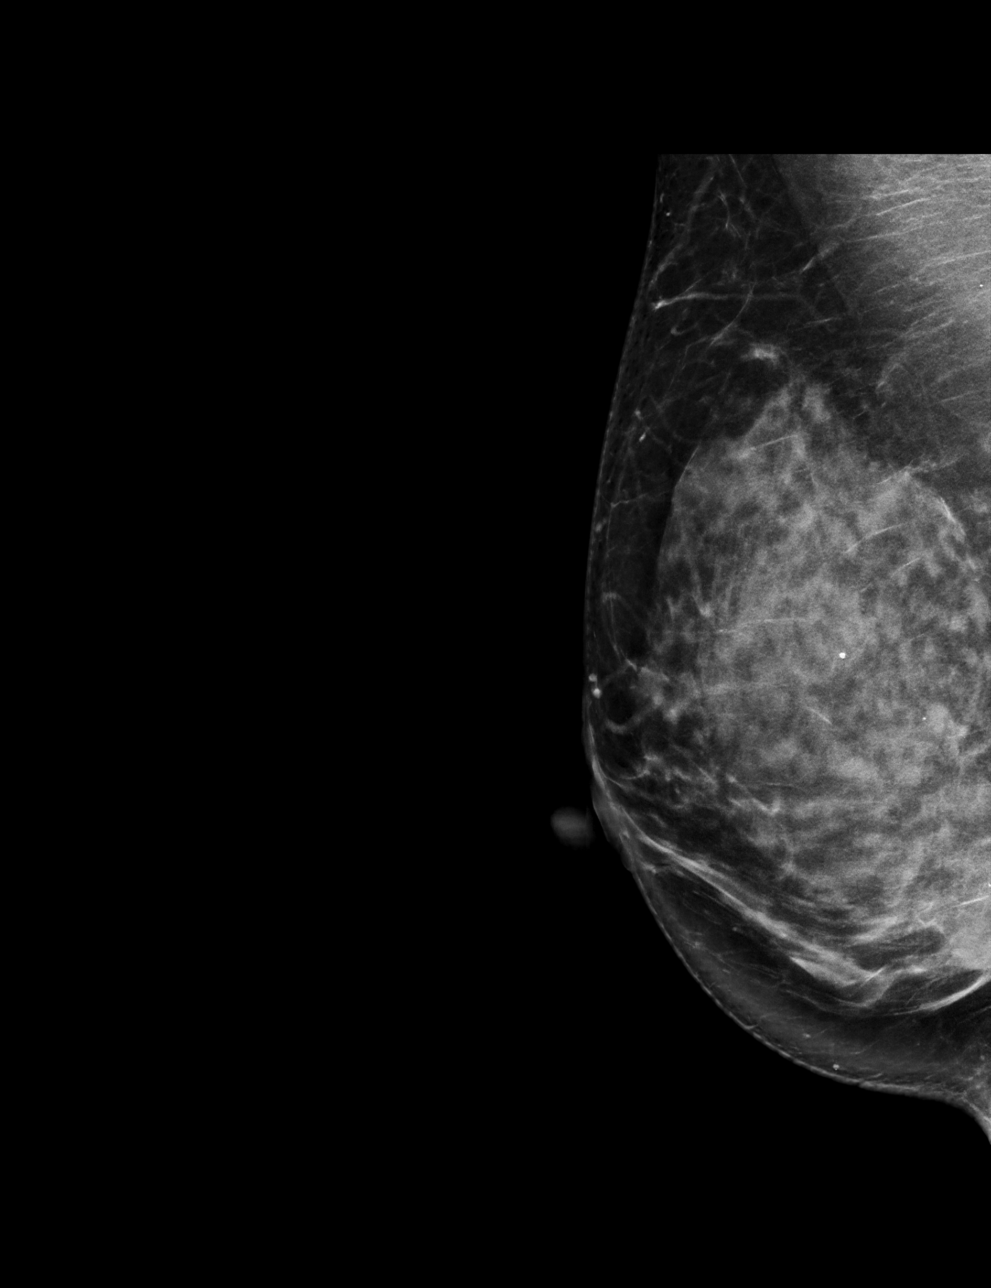

[R MLO tomo · tomo slice 37/73.0]
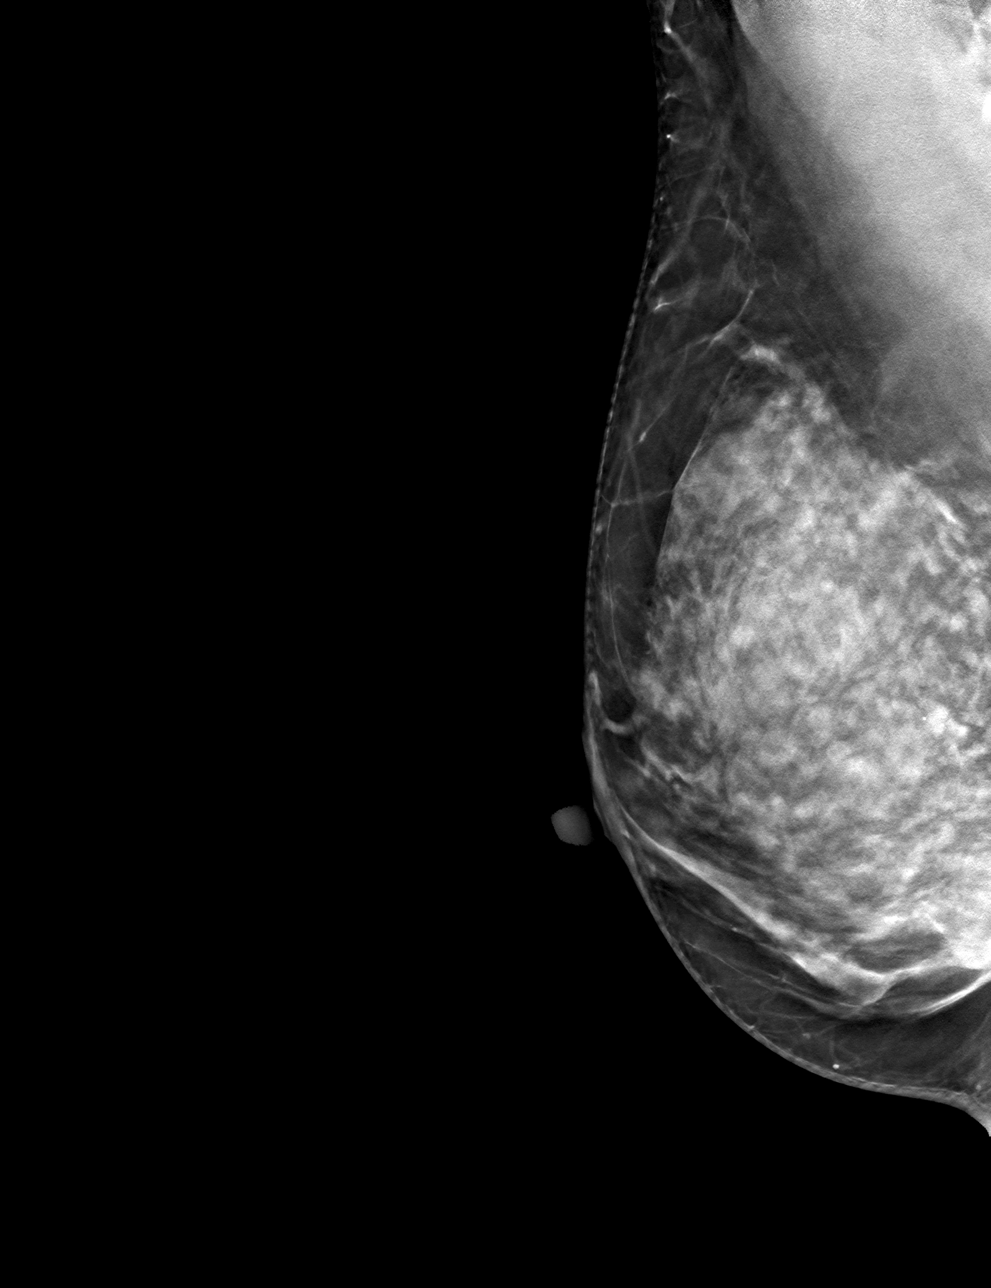

[R CC tomo · tomo slice 36/71.0]
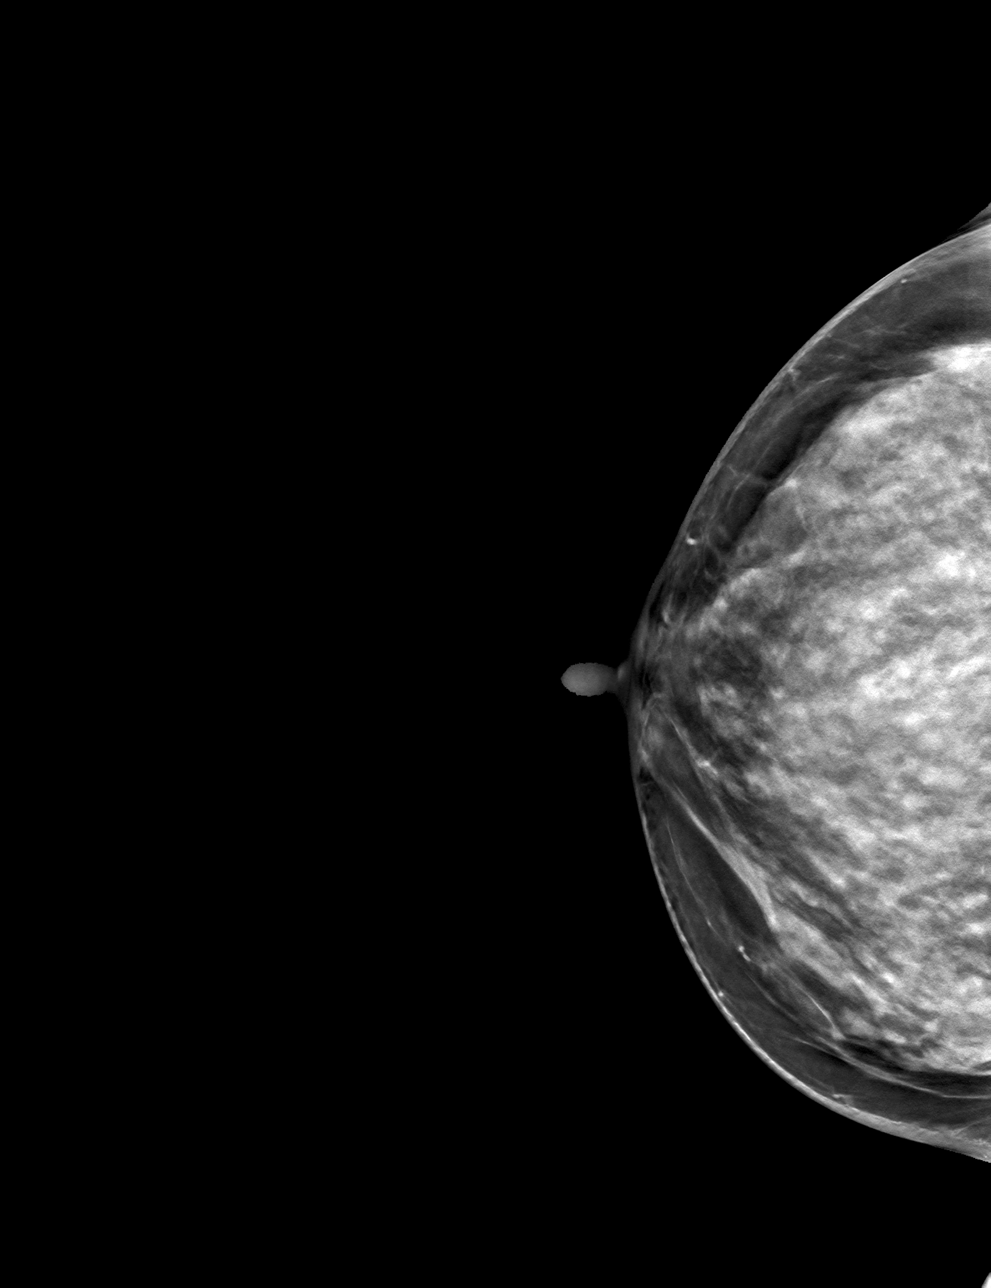

[4 of 12 positions shown; findings below may reference images not displayed]

ACR Breast Density Category d: The breast tissue is extremely dense,
which lowers the sensitivity of mammography.
FINDINGS: There are no new dominant masses, suspicious calcifications or
secondary signs of malignancy identified within the RIGHT breast.

Mammographic images were processed with CAD.

Targeted ultrasound is performed, evaluating the inferior RIGHT
breast, 5-8 o'clock axes, as directed by the patient, showing only
normal fibroglandular tissues and fat lobules throughout. No
suspicious solid or cystic mass is identified. Incidental note made
of a small benign lymph node at the 8 o'clock axis.
IMPRESSION: No evidence of malignancy within the RIGHT breast. Specifically, no
mammographic or sonographic evidence of malignancy within the
inferior RIGHT breast corresponding to the area of clinical concern.

RECOMMENDATION:
1. Annual screening mammograms. Next bilateral screening mammogram
will be due in Tuesday November, 2018 in conjunction with patient's
routine LEFT breast screening mammogram schedule..
2. Benign causes of breast pain, and possible remedies, were
discussed with the patient. Patient was encouraged to follow-up with
referring physician if pain became localized and persistent or if a
palpable lump/mass developed.

I have discussed the findings and recommendations with the patient.
Results were also provided in writing at the conclusion of the
visit. If applicable, a reminder letter will be sent to the patient
regarding the next appointment.

BI-RADS CATEGORY  1: Negative.
